# Patient Record
Sex: Male | Born: 1985 | Hispanic: Yes | Smoking: Never smoker
Health system: Southern US, Community
[De-identification: ages and names within clinical notes are randomized; demographics above are authoritative.]

## PROBLEM LIST (undated history)

## (undated) HISTORY — PX: APPENDECTOMY: SHX54

---

## 2017-02-02 ENCOUNTER — Encounter: Payer: Self-pay | Admitting: Emergency Medicine

## 2017-02-02 ENCOUNTER — Emergency Department: Payer: Worker's Compensation

## 2017-02-02 DIAGNOSIS — W228XXA Striking against or struck by other objects, initial encounter: Secondary | ICD-10-CM | POA: Diagnosis not present

## 2017-02-02 DIAGNOSIS — Y929 Unspecified place or not applicable: Secondary | ICD-10-CM | POA: Diagnosis not present

## 2017-02-02 DIAGNOSIS — S61214A Laceration without foreign body of right ring finger without damage to nail, initial encounter: Secondary | ICD-10-CM | POA: Diagnosis not present

## 2017-02-02 DIAGNOSIS — Y9389 Activity, other specified: Secondary | ICD-10-CM | POA: Diagnosis not present

## 2017-02-02 DIAGNOSIS — Y990 Civilian activity done for income or pay: Secondary | ICD-10-CM | POA: Insufficient documentation

## 2017-02-02 DIAGNOSIS — S6991XA Unspecified injury of right wrist, hand and finger(s), initial encounter: Secondary | ICD-10-CM | POA: Diagnosis present

## 2017-02-02 NOTE — ED Triage Notes (Signed)
Patient ambulatory to triage with steady gait, without difficulty or distress noted; pt reports fixing sock machine and head of machine fell on right 4th finger; c/o pain since; pt employeed with [employer] (workers comp profile indicates UDS required)

## 2017-02-03 ENCOUNTER — Emergency Department
Admission: EM | Admit: 2017-02-03 | Discharge: 2017-02-03 | Disposition: A | Discharge status: Home / Self Care | Payer: Worker's Compensation | Attending: Emergency Medicine | Admitting: Emergency Medicine

## 2017-02-03 DIAGNOSIS — S61214A Laceration without foreign body of right ring finger without damage to nail, initial encounter: Secondary | ICD-10-CM

## 2017-02-03 MED ORDER — PENTAFLUOROPROP-TETRAFLUOROETH EX AERO
INHALATION_SPRAY | CUTANEOUS | Status: AC
  Administered 2017-02-03: 03:00:00
  Filled 2017-02-03: qty 30

## 2017-02-03 MED ORDER — BACITRACIN ZINC 500 UNIT/GM EX OINT
TOPICAL_OINTMENT | CUTANEOUS | Status: AC
  Administered 2017-02-03: 03:00:00
  Filled 2017-02-03: qty 0.9

## 2017-02-03 NOTE — ED Notes (Signed)
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 Urine sent to lab.

## 2017-02-03 NOTE — ED Notes (Addendum)
Patient does not know social security number or have employee ID Number. Patient's Work Merchandiser, retailupervisor, [employer], [employer], is with patient and verifies patient's identity.

## 2017-02-03 NOTE — ED Notes (Signed)
Patient verbalizes understanding of d/c instructions and follow-up. VS stable and pain controlled per patient.  Patient in NAD at time of d/c and denies further concerns regarding this visit. Patient stable at the time of departure from the unit, departing unit by the safest and most appropriate manner per that patients condition and limitations. Patient advised to return to the ED at any time for emergent concerns, or for new/worsening symptoms.    Patient refused wheel chair out  

## 2017-02-03 NOTE — ED Provider Notes (Signed)
[facility] Emergency Department Provider Note    First MD Initiated Contact with Patient 02/03/17 0123     (approximate)  I have reviewed the triage vital signs and the nursing notes.   HISTORY  Chief Complaint No chief complaint on file.    HPI Randall Ray is a 31 y.o. male presents to the emergency department with injury to his right fourth finger which occurred at work. Patient states that he was fixing a sock machine when the head of the machine fell onto his right finger. Patient admits to mild discomfort at this time. Patient does admit to a laceration to the finger. Pain is worse with movement and bending of the finger.   Past medical history None There are no active problems to display for this patient.   Past Surgical History:  Procedure Laterality Date  . APPENDECTOMY      Prior to Admission medications   Not on File    Allergies No known drug allergies No family history on file.  Social History Social History  Substance Use Topics  . Smoking status: Never Smoker  . Smokeless tobacco: Never Used  . Alcohol use No    Review of Systems Constitutional: No fever/chills Eyes: No visual changes. ENT: No sore throat. Cardiovascular: Denies chest pain. Respiratory: Denies shortness of breath. Gastrointestinal: No abdominal pain.  No nausea, no vomiting.  No diarrhea.  No constipation. Genitourinary: Negative for dysuria. Musculoskeletal: Negative for neck pain.  Negative for back pain.Positive for right fourth finger injury with laceration Integumentary: Negative for rash. Positive for right fourth finger laceration Neurological: Negative for headaches, focal weakness or numbness.   ____________________________________________   PHYSICAL EXAM:  VITAL SIGNS: ED Triage Vitals  Enc Vitals Group     BP 02/02/17 2322 (!) 146/83     Pulse Rate 02/02/17 2322 65     Resp 02/02/17 2322 18     Temp 02/02/17 2322 98.4 F (36.9  C)     Temp Source 02/02/17 2322 Oral     SpO2 02/02/17 2322 96 %     Weight 02/02/17 2323 95.3 kg (210 lb)     Height 02/02/17 2323 1.676 m (5\' 6" )     Head Circumference --      Peak Flow --      Pain Score --      Pain Loc --      Pain Edu? --      Excl. in GC? --     Constitutional: Alert and oriented. Well appearing and in no acute distress. Eyes: Conjunctivae are normal. Head: Atraumatic. Musculoskeletal: Superficial irregular shape laceration to the distal aspect of the right fourth finger. Laceration is distal to the DIP joint  Neurologic:  Normal speech and language. No gross focal neurologic deficits are appreciated.  Skin:  Skin is warm, dry and intact. No rash noted. Psychiatric: Mood and affect are normal. Speech and behavior are normal.   RADIOLOGY I, Wittmann N Cailee Blanke, personally viewed and evaluated these images (plain radiographs) as part of my medical decision making, as well as reviewing the written report by the radiologist.  Dg Finger Ring Right  Result Date: 02/03/2017 CLINICAL DATA:  Right ring finger pain following a crush injury. EXAM: RIGHT RING FINGER 2+V COMPARISON:  None. FINDINGS: There is no evidence of fracture or dislocation. There is no evidence of arthropathy or other focal bone abnormality. Soft tissues are unremarkable. IMPRESSION: Normal examination. Electronically Signed   By: Beckie SaltsSteven  Reid  M.D.   On: 02/03/2017 00:34      Procedures   ____________________________________________   INITIAL IMPRESSION / ASSESSMENT AND PLAN / ED COURSE  Pertinent labs & imaging results that were available during my care of the patient were reviewed by me and considered in my medical decision making (see chart for details).  Superficial nature of the patient's wound did not require suture repair. Wound cleaned and dressed with antibiotic ointment. Spoke with the patient at length regarding management at home.       ____________________________________________  FINAL CLINICAL IMPRESSION(S) / ED DIAGNOSES  Final diagnoses:  Laceration of right ring finger without foreign body without damage to nail, initial encounter     MEDICATIONS GIVEN DURING THIS VISIT:  Medications  pentafluoroprop-tetrafluoroeth (GEBAUERS) aerosol (  Given 02/03/17 0230)  bacitracin 500 UNIT/GM ointment (  Given 02/03/17 0230)     NEW OUTPATIENT MEDICATIONS STARTED DURING THIS VISIT:  There are no discharge medications for this patient.   There are no discharge medications for this patient.   There are no discharge medications for this patient.    Note:  This document was prepared using Dragon voice recognition software and may include unintentional dictation errors.    Darci CurrentBrown, [place] N, MD 02/03/17 [id number]

## 2018-08-27 ENCOUNTER — Emergency Department: Payer: BLUE CROSS/BLUE SHIELD

## 2018-08-27 ENCOUNTER — Emergency Department
Admission: EM | Admit: 2018-08-27 | Discharge: 2018-08-28 | Disposition: A | Discharge status: Home / Self Care | Payer: BLUE CROSS/BLUE SHIELD | Attending: Emergency Medicine | Admitting: Emergency Medicine

## 2018-08-27 ENCOUNTER — Other Ambulatory Visit: Payer: Self-pay

## 2018-08-27 DIAGNOSIS — S39011A Strain of muscle, fascia and tendon of abdomen, initial encounter: Secondary | ICD-10-CM | POA: Diagnosis not present

## 2018-08-27 DIAGNOSIS — Y998 Other external cause status: Secondary | ICD-10-CM | POA: Insufficient documentation

## 2018-08-27 DIAGNOSIS — Y9389 Activity, other specified: Secondary | ICD-10-CM | POA: Insufficient documentation

## 2018-08-27 DIAGNOSIS — Y929 Unspecified place or not applicable: Secondary | ICD-10-CM | POA: Insufficient documentation

## 2018-08-27 DIAGNOSIS — S299XXA Unspecified injury of thorax, initial encounter: Secondary | ICD-10-CM | POA: Diagnosis present

## 2018-08-27 DIAGNOSIS — X58XXXA Exposure to other specified factors, initial encounter: Secondary | ICD-10-CM | POA: Insufficient documentation

## 2018-08-27 MED ORDER — KETOROLAC TROMETHAMINE 30 MG/ML IJ SOLN
30.0000 mg | Freq: Once | INTRAMUSCULAR | Status: AC
  Administered 2018-08-27: 30 mg via INTRAMUSCULAR
  Filled 2018-08-27: qty 1

## 2018-08-27 NOTE — ED Triage Notes (Signed)
Patient sneezed hard last Tuesday and since then has had progressively worsening left rib pain. States he can't lean on that side due to pain.

## 2018-08-27 NOTE — ED Notes (Signed)
Of note, pain is reproducible over the left lower rib area.

## 2018-08-28 MED ORDER — MELOXICAM 15 MG PO TABS: 15.0000 mg | ORAL_TABLET | Freq: Every day | ORAL | 1 refills | Status: AC

## 2018-08-28 MED ORDER — METHOCARBAMOL 500 MG PO TABS: 500.0000 mg | ORAL_TABLET | Freq: Three times a day (TID) | ORAL | 0 refills | Status: AC | PRN

## 2018-08-28 NOTE — ED Provider Notes (Signed)
[facility] Emergency Department Provider Note  ____________________________________________  Time seen: Approximately 12:02 AM  I have reviewed the triage vital signs and the nursing notes.   HISTORY  Chief Complaint Chest Pain (Patient sneezed and has been having rib pain since last tuesday. Pain is worsening. )    HPI Randall Ray is a 33 y.o. male presents to the emergency department with tenderness along the left lateral ribs after patient reportedly sneezed 2 days ago.  Patient now states that he has tenderness when he walks or coughs.  He denies fever, chills, nausea or vomiting.  No prior GI issues in the past.  Patient denies chest pain, chest tightness or shortness of breath.  He denies falls or mechanisms of trauma. No alleviating measures have been attempted.    History reviewed. No pertinent past medical history.  There are no active problems to display for this patient.   Past Surgical History:  Procedure Laterality Date  . APPENDECTOMY      Prior to Admission medications   Medication Sig Start Date End Date Taking? Authorizing Provider  meloxicam (MOBIC) 15 MG tablet Take 1 tablet (15 mg total) by mouth daily for 7 days. 08/28/18 09/04/18  Orvil Feil, PA-C  methocarbamol (ROBAXIN) 500 MG tablet Take 1 tablet (500 mg total) by mouth every 8 (eight) hours as needed for up to 5 days. 08/28/18 09/02/18  Orvil Feil, PA-C    Allergies Patient has no known allergies.  No family history on file.  Social History Social History   Tobacco Use  . Smoking status: Never Smoker  . Smokeless tobacco: Never Used  Substance Use Topics  . Alcohol use: No  . Drug use: Not on file     Review of Systems  Constitutional: No fever/chills Eyes: No visual changes. No discharge ENT: No upper respiratory complaints. Cardiovascular: no chest pain. Respiratory: no cough. No SOB. Gastrointestinal: No abdominal pain.  No nausea, no vomiting.  No  diarrhea.  No constipation. Genitourinary: Negative for dysuria. No hematuria Musculoskeletal: Patient has left lateral rib pain. Skin: Negative for rash, abrasions, lacerations, ecchymosis. Neurological: Negative for headaches, focal weakness or numbness.   ____________________________________________   PHYSICAL EXAM:  VITAL SIGNS: ED Triage Vitals  Enc Vitals Group     BP 08/27/18 2152 140/80     Pulse Rate 08/27/18 2152 100     Resp 08/27/18 2152 18     Temp 08/27/18 2152 98.7 F (37.1 C)     Temp Source 08/27/18 2152 Oral     SpO2 08/27/18 2152 97 %     Weight 08/27/18 2153 210 lb (95.3 kg)     Height 08/27/18 2153 5\' 7"  (1.702 m)     Head Circumference --      Peak Flow --      Pain Score 08/27/18 2226 8     Pain Loc --      Pain Edu? --      Excl. in GC? --      Constitutional: Alert and oriented. Well appearing and in no acute distress. Eyes: Conjunctivae are normal. PERRL. EOMI. Head: Atraumatic.  Cardiovascular: Normal rate, regular rhythm. Normal S1 and S2.  Good peripheral circulation. Respiratory: Normal respiratory effort without tachypnea or retractions. Lungs CTAB. Good air entry to the bases with no decreased or absent breath sounds. Gastrointestinal: Bowel sounds 4 quadrants.  Patient has focal tenderness over the inferior margin of the left lateral ribs.  No guarding or rigidity. No  palpable masses. No distention. No CVA tenderness. Musculoskeletal: Full range of motion to all extremities. No gross deformities appreciated. Neurologic:  Normal speech and language. No gross focal neurologic deficits are appreciated.  Skin:  Skin is warm, dry and intact. No rash noted. Psychiatric: Mood and affect are normal. Speech and behavior are normal. Patient exhibits appropriate insight and judgement.   ____________________________________________   LABS (all labs ordered are listed, but only abnormal results are displayed)  Labs Reviewed - No data to  display ____________________________________________  EKG   ____________________________________________  RADIOLOGY I personally viewed and evaluated these images as part of my medical decision making, as well as reviewing the written report by the radiologist.  Dg Ribs Unilateral W/chest Left  Result Date: 08/27/2018 CLINICAL DATA:  Cough.  Left lateral rib pain. EXAM: LEFT RIBS AND CHEST - 3+ VIEW COMPARISON:  None. FINDINGS: No fracture or other bone lesions are seen involving the ribs. There is no evidence of pneumothorax or pleural effusion. Both lungs are clear. Heart size and mediastinal contours are within normal limits. IMPRESSION: Negative. Electronically Signed   By: Charlett Nose M.D.   On: 08/27/2018 23:32    ____________________________________________    PROCEDURES  Procedure(s) performed:    Procedures    Medications  ketorolac (TORADOL) 30 MG/ML injection 30 mg (30 mg Intramuscular Given 08/27/18 2350)     ____________________________________________   INITIAL IMPRESSION / ASSESSMENT AND PLAN / ED COURSE  Pertinent labs & imaging results that were available during my care of the patient were reviewed by me and considered in my medical decision making (see chart for details).  Review of the [place] CSRS was performed in accordance of the [facility] prior to dispensing any controlled drugs.    Assessment and Plan:  Abdominal wall strain Patient presents to the emergency department with focal tenderness over the inferior margin of the left lateral ribs after patient sneezed.  Patient reportedly heard a pop when incident occurred.  Patient denied nausea, vomiting, diarrhea or fever.  Patient had no guarding or rigidity.  Chest x-ray reveals no evidence of rib fracture or pneumothorax.  Patient's pain improved significantly with Toradol administered in the emergency department.  Patient was discharged with Robaxin and meloxicam.  Strict return precautions were given to  return to the emergency department for new or worsening symptoms.  All patient questions were answered.    ____________________________________________  FINAL CLINICAL IMPRESSION(S) / ED DIAGNOSES  Final diagnoses:  Strain of abdominal wall, initial encounter      NEW MEDICATIONS STARTED DURING THIS VISIT:  ED Discharge Orders         Ordered    methocarbamol (ROBAXIN) 500 MG tablet  Every 8 hours PRN     08/28/18 0012    meloxicam (MOBIC) 15 MG tablet  Daily     08/28/18 0012              This chart was dictated using voice recognition software/Dragon. Despite best efforts to proofread, errors can occur which can change the meaning. Any change was purely unintentional.    Orvil Feil, PA-C 08/28/18 0018    Minna Antis, MD 08/29/18 [phone]

## 2019-01-26 ENCOUNTER — Other Ambulatory Visit: Payer: Self-pay

## 2019-01-26 DIAGNOSIS — Z20822 Contact with and (suspected) exposure to covid-19: Secondary | ICD-10-CM

## 2019-01-29 LAB — NOVEL CORONAVIRUS, NAA: SARS-CoV-2, NAA: NOT DETECTED

## 2019-08-13 IMAGING — CR DG RIBS W/ CHEST 3+V*L*
1 series · 5 of 5 positions shown · non-contrast
Comparison: None.

CLINICAL DATA: Cough.  Left lateral rib pain.

EXAM:
LEFT RIBS AND CHEST - 3+ VIEW

[Series 1: dg ribs unilateral w/chest left · 0.14mm/px · 5 of 5 slices shown]
[im 1/5]
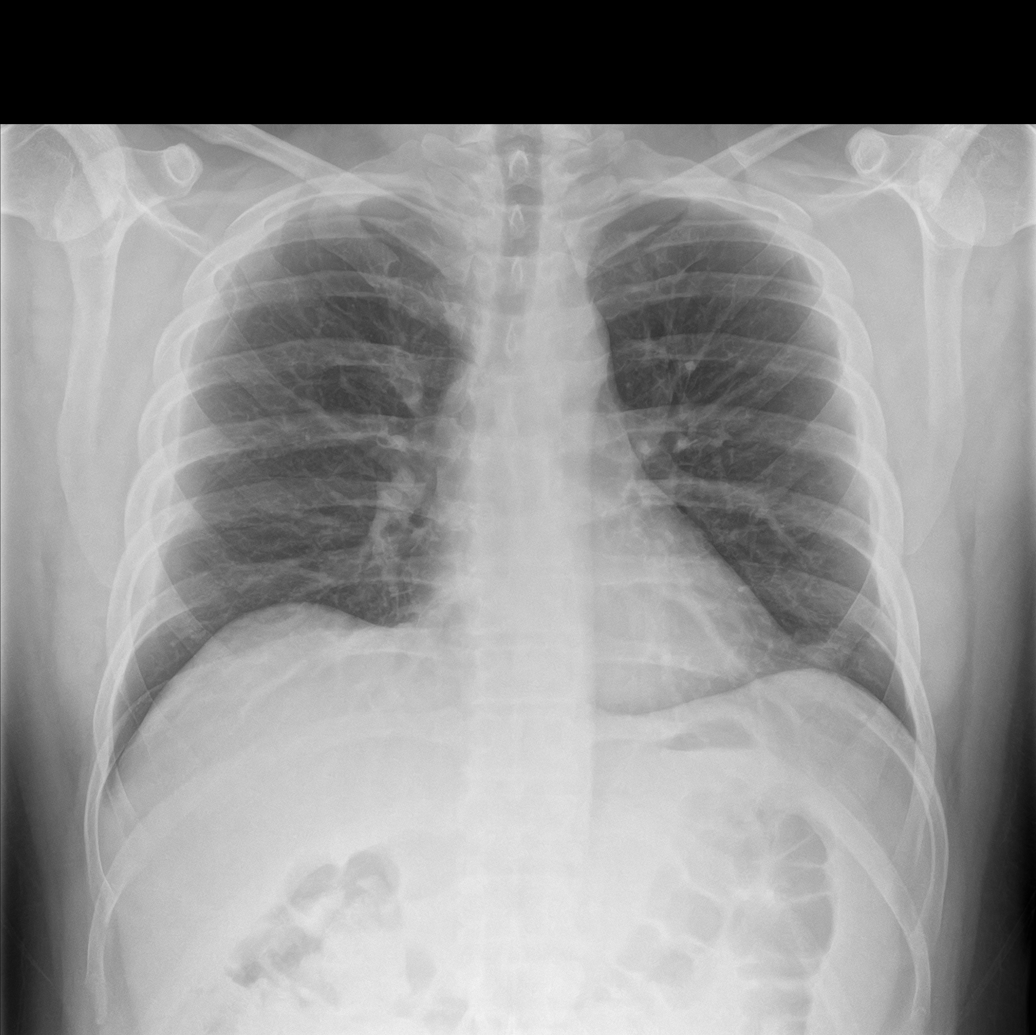
[im 2/5]
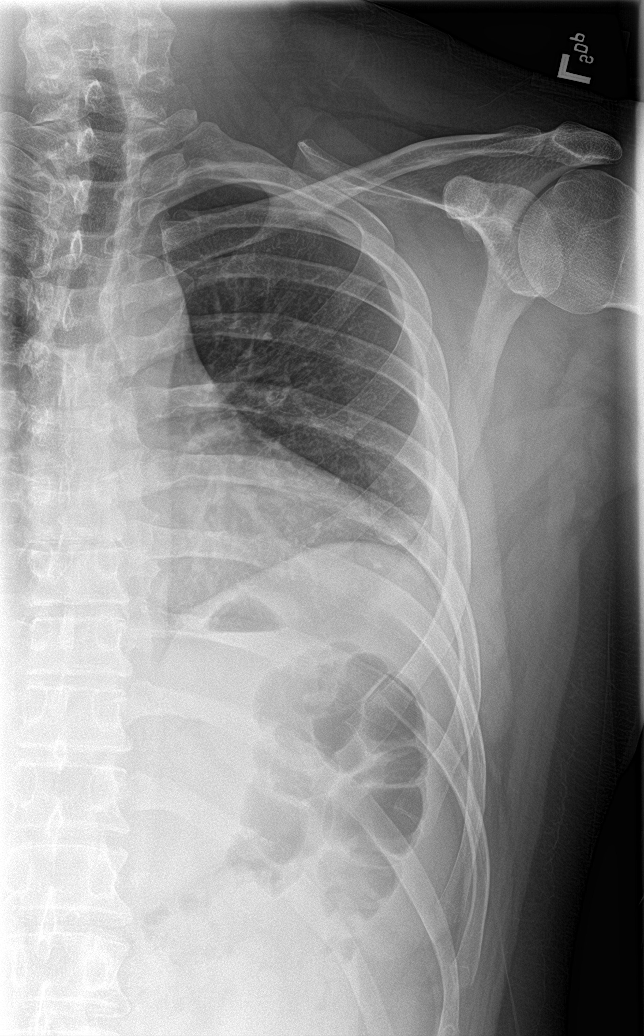
[im 3/5]
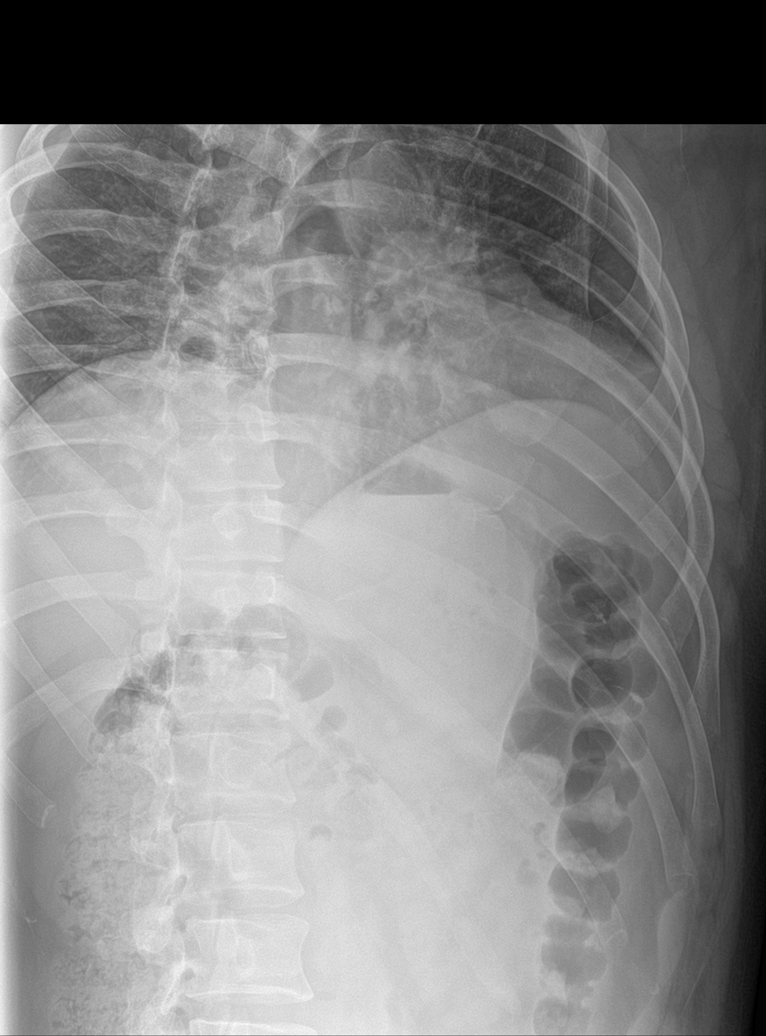
[im 4/5]
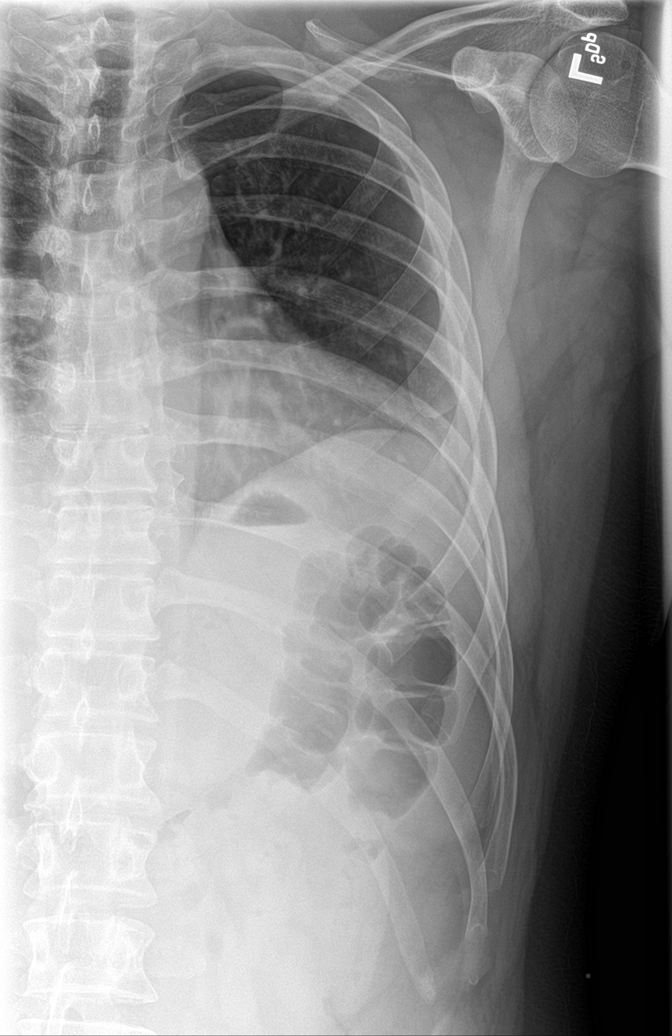
[im 5/5]
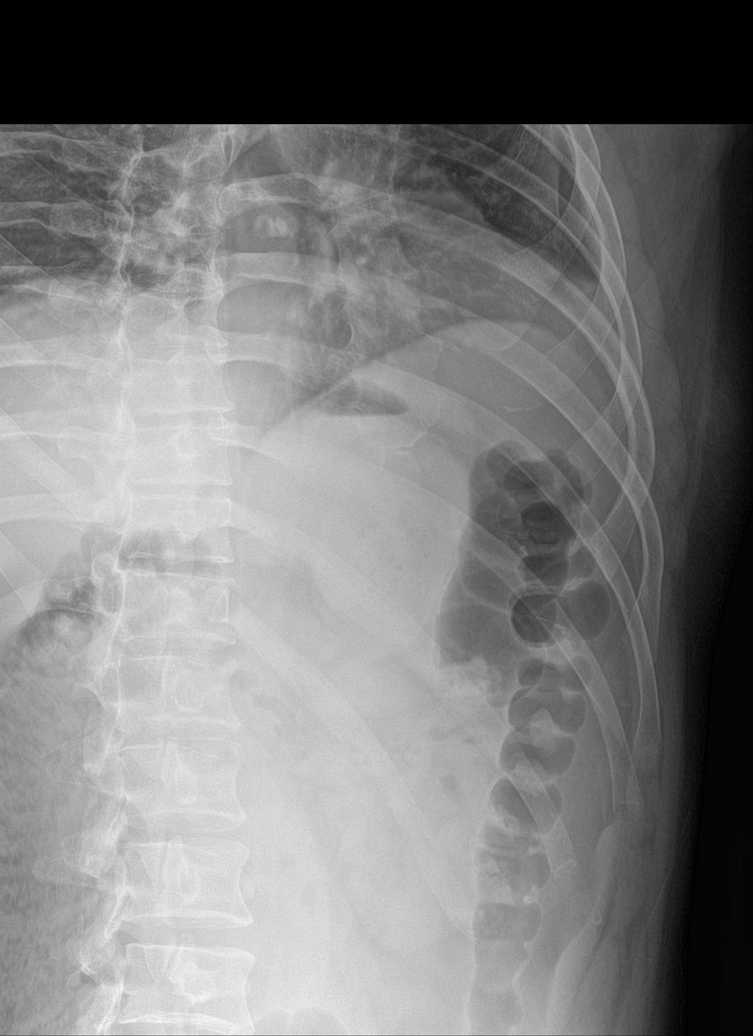

[5 of 5 positions shown; findings below may reference images not displayed]

FINDINGS: No fracture or other bone lesions are seen involving the ribs. There
is no evidence of pneumothorax or pleural effusion. Both lungs are
clear. Heart size and mediastinal contours are within normal limits.
IMPRESSION: Negative.

## 2019-09-21 ENCOUNTER — Ambulatory Visit: Payer: BLUE CROSS/BLUE SHIELD | Attending: Internal Medicine

## 2019-09-21 DIAGNOSIS — Z23 Encounter for immunization: Secondary | ICD-10-CM

## 2019-09-21 NOTE — Progress Notes (Signed)
   Covid-19 Vaccination Clinic  Name:  Fue Cervenka    MRN: [id number] DOB: 08-27-1985  09/21/2019  Mr. Tienda was observed post Covid-19 immunization for 15 minutes without incident. He was provided with Vaccine Information Sheet and instruction to access the V-Safe system.   Mr. Hlavaty was instructed to call 911 with any severe reactions post vaccine: Marland Kitchen Difficulty breathing  . Swelling of face and throat  . A fast heartbeat  . A bad rash all over body  . Dizziness and weakness   Immunizations Administered    Name Date Dose VIS Date Route   Pfizer COVID-19 Vaccine 09/21/2019  4:10 PM 0.3 mL 06/15/2019 Intramuscular   Manufacturer: [employer], [employer]   Lot: [id number]   NDC: [address]-2

## 2019-10-12 ENCOUNTER — Ambulatory Visit: Payer: BLUE CROSS/BLUE SHIELD | Attending: Internal Medicine

## 2019-10-16 ENCOUNTER — Ambulatory Visit: Payer: BLUE CROSS/BLUE SHIELD | Attending: Internal Medicine

## 2019-10-16 DIAGNOSIS — Z23 Encounter for immunization: Secondary | ICD-10-CM

## 2019-10-16 NOTE — Progress Notes (Signed)
   Covid-19 Vaccination Clinic  Name:  Randall Ray    MRN: [id number] DOB: February 01, 1986  10/16/2019  [name] was observed post Covid-19 immunization for 15 minutes without incident. He was provided with Vaccine Information Sheet and instruction to access the V-Safe system.   [name] was instructed to call 911 with any severe reactions post vaccine: Marland Kitchen Difficulty breathing  . Swelling of face and throat  . A fast heartbeat  . A bad rash all over body  . Dizziness and weakness   Immunizations Administered    Name Date Dose VIS Date Route   Pfizer COVID-19 Vaccine 10/16/2019  3:27 PM 0.3 mL 06/15/2019 Intramuscular   Manufacturer: [employer], [employer]   Lot: [id number]   NDC: [address]-1

## 2023-08-15 DIAGNOSIS — E669 Obesity, unspecified: Secondary | ICD-10-CM | POA: Diagnosis not present

## 2023-08-15 DIAGNOSIS — R0683 Snoring: Secondary | ICD-10-CM | POA: Diagnosis not present

## 2023-08-15 DIAGNOSIS — Z113 Encounter for screening for infections with a predominantly sexual mode of transmission: Secondary | ICD-10-CM | POA: Diagnosis not present

## 2023-08-15 DIAGNOSIS — Z1389 Encounter for screening for other disorder: Secondary | ICD-10-CM | POA: Diagnosis not present

## 2023-08-15 DIAGNOSIS — Z13220 Encounter for screening for lipoid disorders: Secondary | ICD-10-CM | POA: Diagnosis not present

## 2023-08-15 DIAGNOSIS — Z1159 Encounter for screening for other viral diseases: Secondary | ICD-10-CM | POA: Diagnosis not present

## 2023-08-15 DIAGNOSIS — Z131 Encounter for screening for diabetes mellitus: Secondary | ICD-10-CM | POA: Diagnosis not present

## 2023-09-02 DIAGNOSIS — J342 Deviated nasal septum: Secondary | ICD-10-CM | POA: Diagnosis not present

## 2023-09-02 DIAGNOSIS — J301 Allergic rhinitis due to pollen: Secondary | ICD-10-CM | POA: Diagnosis not present

## 2023-09-02 DIAGNOSIS — R0683 Snoring: Secondary | ICD-10-CM | POA: Diagnosis not present

## 2023-09-02 DIAGNOSIS — J340 Abscess, furuncle and carbuncle of nose: Secondary | ICD-10-CM | POA: Diagnosis not present
